# Patient Record
Sex: Male | Born: 1975 | Race: Asian | Hispanic: No | Marital: Married
Health system: Southern US, Community
[De-identification: ages and names within clinical notes are randomized; demographics above are authoritative.]

---

## 2014-09-22 ENCOUNTER — Other Ambulatory Visit: Payer: Self-pay | Admitting: Neurology

## 2014-09-22 ENCOUNTER — Ambulatory Visit
Admission: RE | Admit: 2014-09-22 | Discharge: 2014-09-22 | Disposition: A | Discharge status: Home / Self Care | Payer: Managed Care, Other (non HMO) | Source: Ambulatory Visit | Attending: Neurology | Admitting: Neurology

## 2014-09-22 DIAGNOSIS — M542 Cervicalgia: Secondary | ICD-10-CM

## 2015-01-25 ENCOUNTER — Other Ambulatory Visit: Payer: Self-pay | Admitting: Neurology

## 2015-01-25 DIAGNOSIS — M542 Cervicalgia: Secondary | ICD-10-CM

## 2015-01-25 DIAGNOSIS — R519 Headache, unspecified: Secondary | ICD-10-CM

## 2015-01-25 DIAGNOSIS — R51 Headache: Secondary | ICD-10-CM

## 2015-01-29 ENCOUNTER — Ambulatory Visit
Admission: RE | Admit: 2015-01-29 | Discharge: 2015-01-29 | Disposition: A | Discharge status: Home / Self Care | Payer: 59 | Source: Ambulatory Visit | Attending: Neurology | Admitting: Neurology

## 2015-01-29 DIAGNOSIS — R51 Headache: Secondary | ICD-10-CM

## 2015-01-29 DIAGNOSIS — M542 Cervicalgia: Secondary | ICD-10-CM

## 2015-01-29 DIAGNOSIS — R519 Headache, unspecified: Secondary | ICD-10-CM

## 2016-10-08 IMAGING — MR MR CERVICAL SPINE W/O CM
4 of 5 series · 22 of 48 positions shown · non-contrast
Comparison: Plain films 09/22/2014.

CLINICAL DATA: Chronic neck pain for 6 years. No extremity
radicular symptoms.

EXAM:
MRI CERVICAL SPINE WITHOUT CONTRAST
TECHNIQUE: Multiplanar, multisequence MR imaging of the cervical spine was
performed. No intravenous contrast was administered.

[Series 3: T2 · sagittal · 3.0mm · 0.41mm/px · 7 of 12 slices shown (1 of 3)]
[im 1/12]
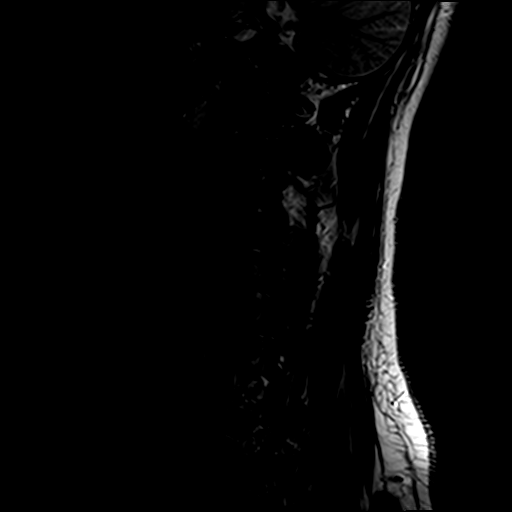
[im 2/12]
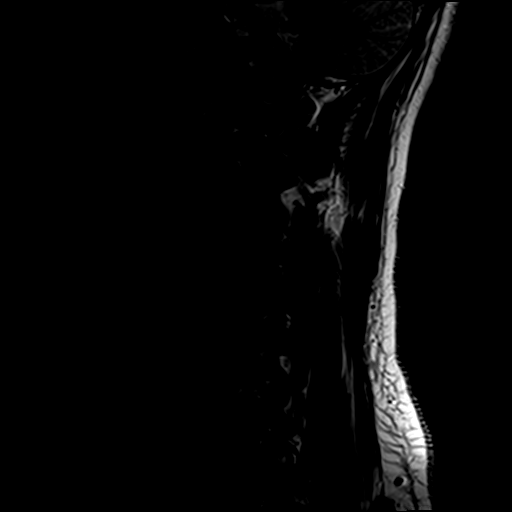
[im 4/12]
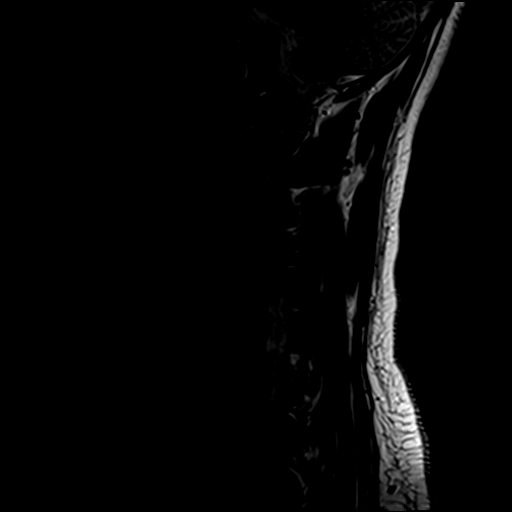
[im 6/12]
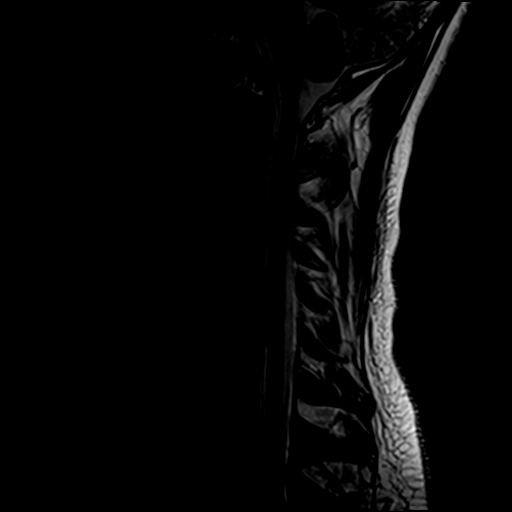
[im 8/12]
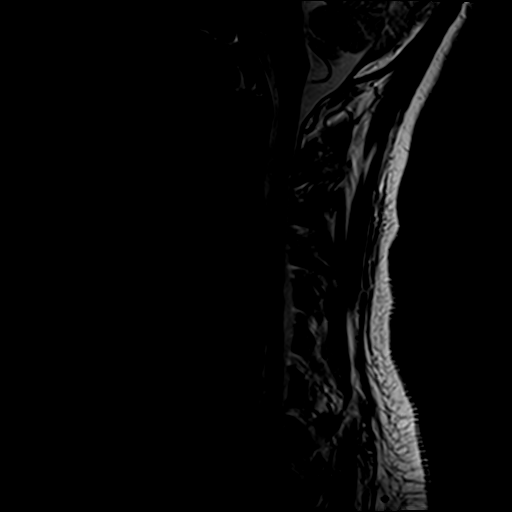
[im 10/12]
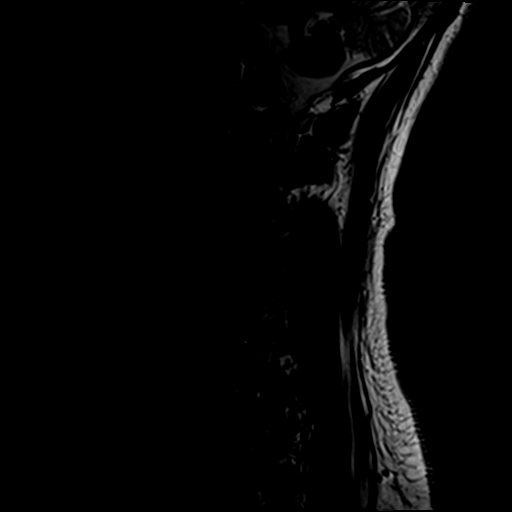
[im 12/12]
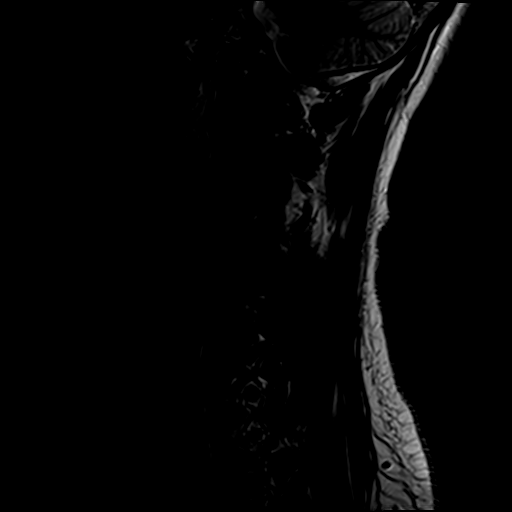

[Series 4: T1 · sagittal · 3.0mm · 0.41mm/px · 3 of 12 slices shown]
[im 2/12]
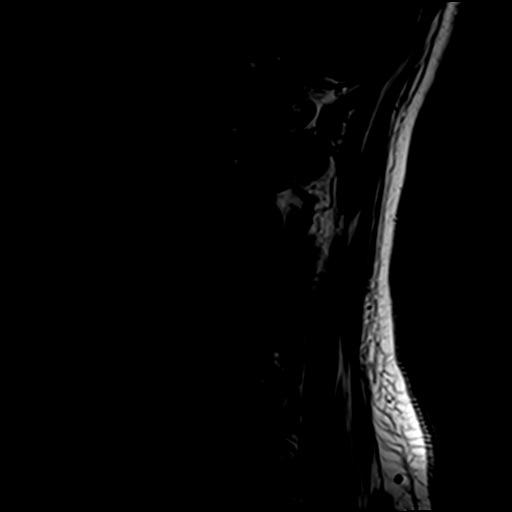
[im 6/12]
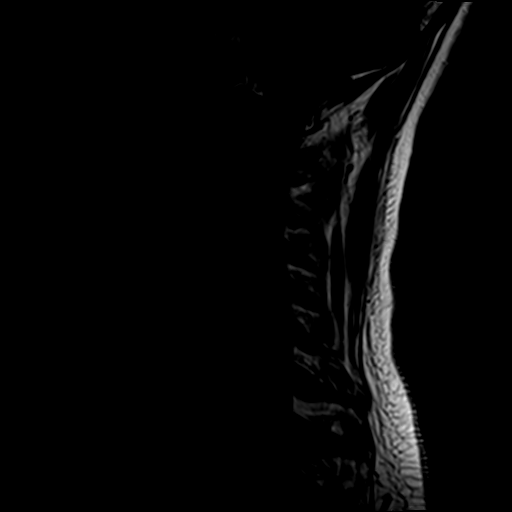
[im 10/12]
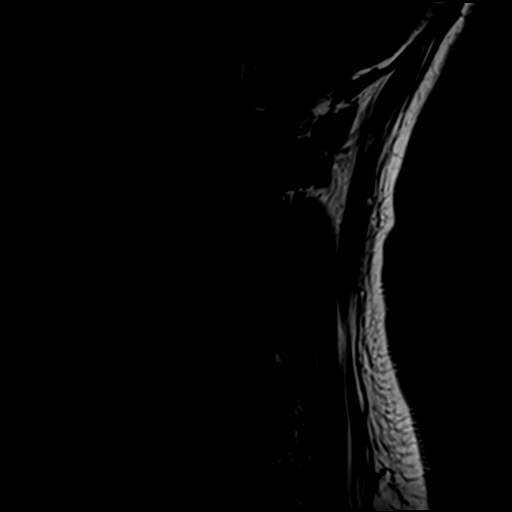

[Series 7: T2 · axial · 3.0mm · 0.39mm/px · z∈[-76,+21]mm · 8 of 27 slices shown (2 of 3)]
[im 1/27]
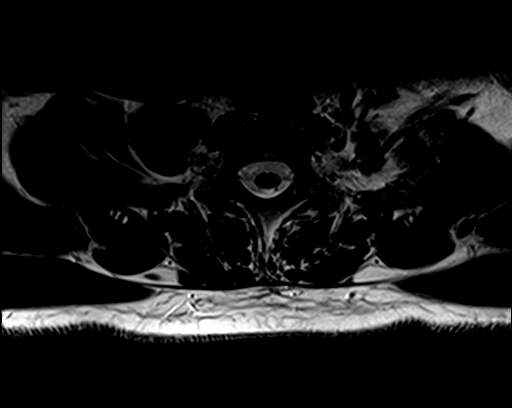
[im 5/27]
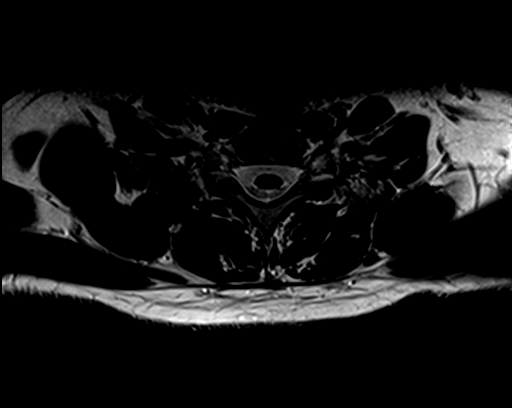
[im 9/27]
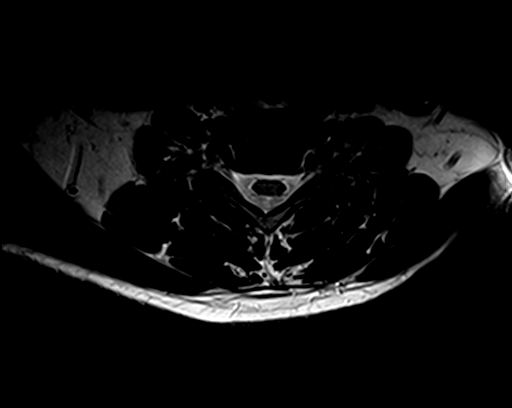
[im 13/27]
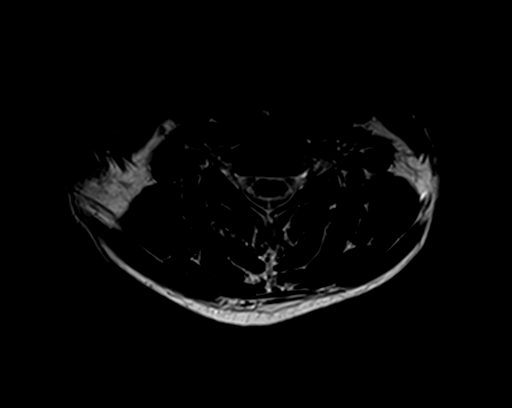
[im 15/27]
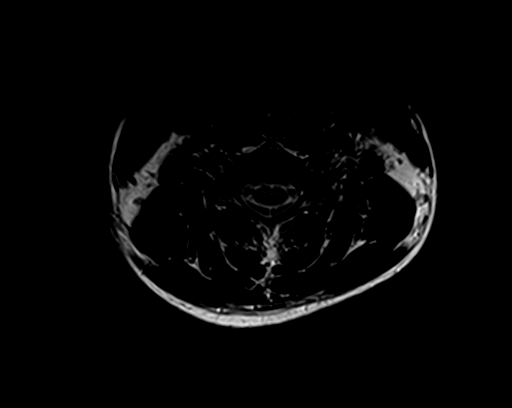
[im 19/27]
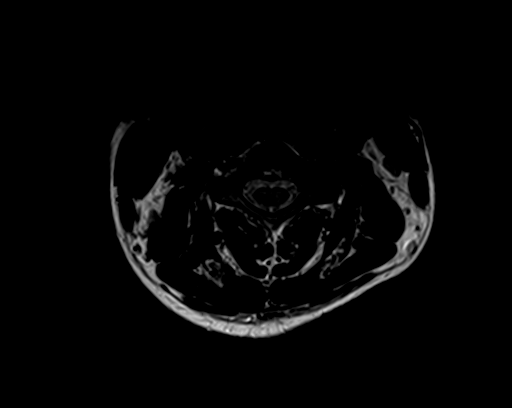
[im 23/27]
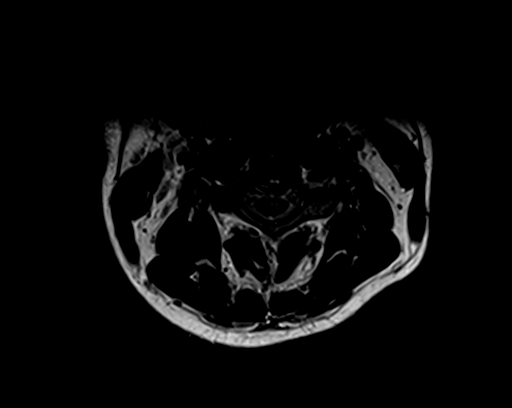
[im 27/27]
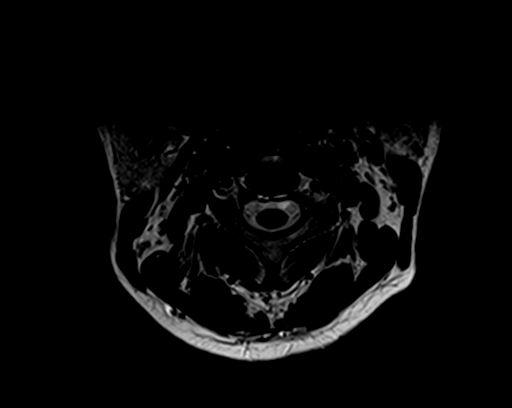

[Series 8: T2 · axial · 3.0mm · 0.39mm/px · z∈[-76,+6]mm · 4 of 27 slices shown (3 of 3)]
[im 1/27]
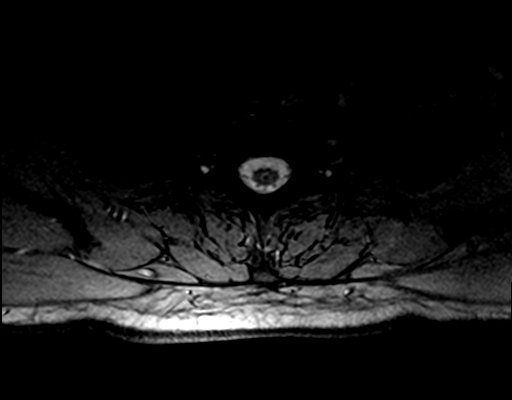
[im 5/27]
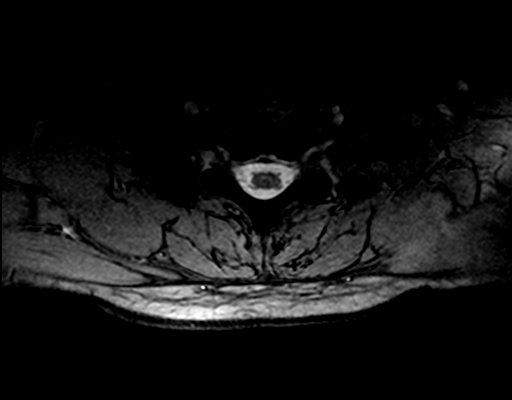
[im 15/27]
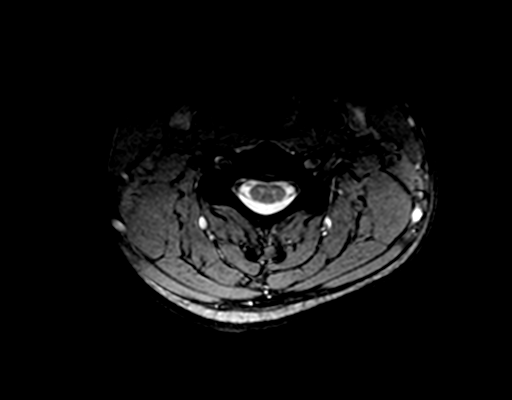
[im 23/27]
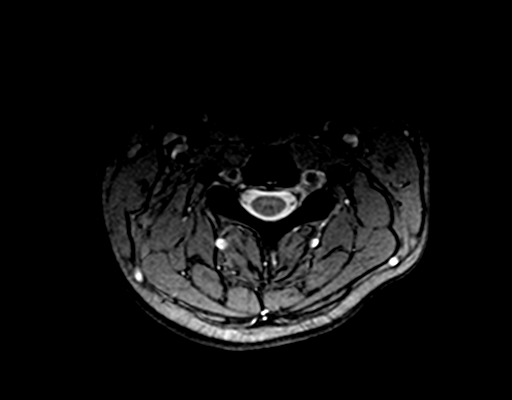

[22 of 48 positions shown; findings below may reference images not displayed]

FINDINGS: Alignment: Mild straightening of the normal cervical lordosis. No
subluxation.

Vertebrae: Normal.

Cord: Normal.

Posterior Fossa: No tonsillar herniation.

Vertebral Arteries: Patent and equal.

Paraspinal tissues: Shotty cervical lymph nodes, not pathologically
enlarged.

Disc levels:

The individual disc spaces were examined as follows:

C2-3:  Normal.

C3-4:  Normal.

C4-5:  Normal.

C5-6:  Normal.

C6-7:  Shallow central protrusion.  No impingement.

C7-T1:  Normal.
IMPRESSION: Mild reversal of the normal cervical lordotic curve without
subluxation.

Shallow central protrusion at C6-7, noncompressive.

No foraminal narrowing or spinal stenosis.
